# Patient Record
Sex: Male | Born: 1990 | Race: White | Hispanic: No | Marital: Single | Smoking: Current every day smoker
Health system: Southern US, Community
[De-identification: ages and names within clinical notes are randomized; demographics above are authoritative.]

---
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
## 2013-04-08 ENCOUNTER — Encounter: Payer: Self-pay | Admitting: Emergency Medicine

## 2013-04-08 ENCOUNTER — Emergency Department
Admission: EM | Admit: 2013-04-08 | Discharge: 2013-04-08 | Disposition: A | Discharge status: Home / Self Care | Payer: 59 | Source: Home / Self Care | Attending: Emergency Medicine | Admitting: Emergency Medicine

## 2013-04-08 DIAGNOSIS — R300 Dysuria: Secondary | ICD-10-CM

## 2013-04-08 LAB — POCT URINALYSIS DIP (MANUAL ENTRY)
GLUCOSE UA: NEGATIVE
LEUKOCYTES UA: NEGATIVE
Nitrite, UA: NEGATIVE
PROTEIN UA: NEGATIVE
RBC UA: NEGATIVE
SPEC GRAV UA: 1.025 (ref 1.005–1.03)
UROBILINOGEN UA: 1 (ref 0–1)
pH, UA: 6 (ref 5–8)

## 2013-04-08 MED ORDER — CEFTRIAXONE SODIUM 500 MG IJ SOLR
500.0000 mg | Freq: Once | INTRAMUSCULAR | Status: AC
  Administered 2013-04-08: 500 mg via INTRAMUSCULAR

## 2013-04-08 MED ORDER — AZITHROMYCIN 250 MG PO TABS: ORAL_TABLET | ORAL | Status: AC

## 2013-04-08 NOTE — ED Provider Notes (Signed)
CSN: [id number]     Arrival date & time 04/08/13  1253 History   First MD Initiated Contact with Patient 04/08/13 1304     Chief Complaint  Patient presents with  . Dysuria   (Consider location/radiation/quality/duration/timing/severity/associated sxs/prior Treatment) HPI Derrick Wells is a [age] y.o. male who presents today with dysuria for 2 days. But symptoms have been on & off for months.  Usually lasts a few days and then goes away. + dysuria +/- frequency No urgency No hematuria + white penile discharge No fever/chills +/- lower abdominal pain No back pain No fatigue   + possible history of kidney stone + currently monogamous with girlfriend and uses condoms. + possible history of STD but unsure  History reviewed. No pertinent past medical history. History reviewed. No pertinent past surgical history. History reviewed. No pertinent family history. History  Substance Use Topics  . Smoking status: Never Smoker   . Smokeless tobacco: Never Used  . Alcohol Use: No    Review of Systems  All other systems reviewed and are negative.    Allergies  Penicillins  Home Medications   Current Outpatient Rx  Name  Route  Sig  Dispense  Refill  . azithromycin (ZITHROMAX) 250 MG tablet      Take 1 gram PO x1 dose   4 each   0    BP 114/69  Pulse 72  Temp(Src) 98.4 F (36.9 C) (Oral)  Resp 16  Ht 5\' 11"  (1.803 m)  Wt 165 lb (74.844 kg)  BMI 23.02 kg/m2  SpO2 100% Physical Exam  Nursing note and vitals reviewed. Constitutional: He is oriented to person, place, and time. He appears well-developed and well-nourished.  Non-toxic appearance. He does not appear ill.  HENT:  Head: Normocephalic and atraumatic.  Eyes: No scleral icterus.  Neck: Neck supple.  Cardiovascular: Regular rhythm and normal heart sounds.   Pulmonary/Chest: Effort normal and breath sounds normal. No respiratory distress.  Abdominal: Soft. Normal appearance. There is no tenderness. There is no rebound,  no CVA tenderness, no tenderness at McBurney's point and negative Murphy's sign.  Neurological: He is alert and oriented to person, place, and time.  Skin: Skin is warm and dry.  Psychiatric: He has a normal mood and affect. His speech is normal.    ED Course  Procedures (including critical care time) Labs Review Labs Reviewed  URINE CULTURE  GC/CHLAMYDIA PROBE AMP, URINE  POCT URINALYSIS DIP (MANUAL ENTRY)   Imaging Review No results found.   MDM   1. Dysuria    23 year old male with dysuria.  Most likely STD.  UA normal.  Culture pending.  Urine GC & Chl pending.  IM rocephin given in clinic.  Pt with PCN allergy (rash) but not anaphylaxis. Pt monitored for 20 mins afterwards with no side effect.  1 gram of Zithromax called in to his pharmacy.  Safe sex practices discussed.  If results come back positive, his partner will also need to be treated.  Also gave him a strained to strain urine for the next few days in case is kidney stone related.  Follow up with PCP or urology if not improving.  Marlaine HindJeffrey H Henderson, MD 04/08/13 [phone]

## 2013-04-08 NOTE — ED Notes (Signed)
Patient c/o abdominal pain, dysuria and not completely voiding and white discharge after voiding for several months comes and goes. Has not tried anything otc.

## 2013-04-09 LAB — URINE CULTURE
Colony Count: NO GROWTH
Organism ID, Bacteria: NO GROWTH

## 2013-04-10 LAB — GC/CHLAMYDIA PROBE AMP, URINE
Chlamydia, Swab/Urine, PCR: NEGATIVE
GC Probe Amp, Urine: NEGATIVE

## 2021-11-17 ENCOUNTER — Emergency Department (HOSPITAL_BASED_OUTPATIENT_CLINIC_OR_DEPARTMENT_OTHER)
Admission: EM | Admit: 2021-11-17 | Discharge: 2021-11-17 | Disposition: A | Discharge status: Home / Self Care | Payer: Self-pay | Attending: Emergency Medicine | Admitting: Emergency Medicine

## 2021-11-17 ENCOUNTER — Other Ambulatory Visit: Payer: Self-pay

## 2021-11-17 ENCOUNTER — Encounter (HOSPITAL_BASED_OUTPATIENT_CLINIC_OR_DEPARTMENT_OTHER): Payer: Self-pay

## 2021-11-17 ENCOUNTER — Emergency Department (HOSPITAL_BASED_OUTPATIENT_CLINIC_OR_DEPARTMENT_OTHER): Payer: Self-pay

## 2021-11-17 DIAGNOSIS — N503 Cyst of epididymis: Secondary | ICD-10-CM | POA: Insufficient documentation

## 2021-11-17 LAB — URINALYSIS, ROUTINE W REFLEX MICROSCOPIC
Bilirubin Urine: NEGATIVE
Glucose, UA: NEGATIVE mg/dL
Hgb urine dipstick: NEGATIVE
Ketones, ur: NEGATIVE mg/dL
Leukocytes,Ua: NEGATIVE
Nitrite: NEGATIVE
Protein, ur: NEGATIVE mg/dL
Specific Gravity, Urine: 1.025 (ref 1.005–1.030)
pH: 5.5 (ref 5.0–8.0)

## 2021-11-17 MED ORDER — OXYCODONE HCL 5 MG PO TABS: 5.0000 mg | ORAL_TABLET | ORAL | 0 refills | Status: AC | PRN

## 2021-11-17 MED ORDER — IBUPROFEN 600 MG PO TABS: 600.0000 mg | ORAL_TABLET | Freq: Four times a day (QID) | ORAL | 0 refills | Status: AC | PRN

## 2021-11-17 MED ORDER — IBUPROFEN 400 MG PO TABS
600.0000 mg | ORAL_TABLET | Freq: Once | ORAL | Status: AC
  Administered 2021-11-17: 600 mg via ORAL
  Filled 2021-11-17: qty 1

## 2021-11-17 MED ORDER — OXYCODONE-ACETAMINOPHEN 5-325 MG PO TABS: 1.0000 | ORAL_TABLET | Freq: Once | ORAL | Status: DC

## 2021-11-17 NOTE — ED Provider Notes (Signed)
[facility] EMERGENCY DEPT Provider Note   CSN: [id number] Arrival date & time: 11/17/21  1232     History  Chief Complaint  Patient presents with   Testicle Pain    Derrick Wells is a 31 y.o. male.   Testicle Pain   31 year old male presents emergency department with complaints of testicular pain.  Patient states that pain has been present for approximately the past week.  He has taken no medication for this.  States that pain has been constant since onset and is exacerbated with touch as well as during sexual intercourse.  He states he is not concerned for sexually transmitted disease given that he is monogamous with his partner who present in the room with him.  Reports activity and unprotected anal intercourse.  Denies fever, chills, night sweats, abdominal pain, nausea, vomiting, dysuria, hematuria, urethral discharge, change in bowel habits.  Patient states the pain has been partially relieved by baths as well as warm wet rags.  No significant past medical history.  Home Medications Prior to Admission medications   Medication Sig Start Date End Date Taking? Authorizing Provider  ibuprofen (ADVIL) 600 MG tablet Take 1 tablet (600 mg total) by mouth every 6 (six) hours as needed. 11/17/21  Yes Sherian Maroon A, PA  oxyCODONE (ROXICODONE) 5 MG immediate release tablet Take 1 tablet (5 mg total) by mouth every 4 (four) hours as needed for severe pain. 11/17/21  Yes Peter Garter, PA  azithromycin (ZITHROMAX) 250 MG tablet Take 1 gram PO x1 dose 04/08/13   Marlaine Hind, MD      Allergies    Penicillins    Review of Systems   Review of Systems  Genitourinary:  Positive for testicular pain.  All other systems reviewed and are negative.   Physical Exam Updated Vital Signs BP 113/76 (BP Location: Right Arm)   Pulse 64   Temp 97.8 F (36.6 C) (Oral)   Resp 18   Ht 5\' 11"  (1.803 m)   Wt 72.6 kg   SpO2 100%   BMI 22.32 kg/m  Physical  Exam Vitals and nursing note reviewed.  Constitutional:      General: He is not in acute distress.    Appearance: He is well-developed.  HENT:     Head: Normocephalic and atraumatic.  Eyes:     Conjunctiva/sclera: Conjunctivae normal.  Cardiovascular:     Rate and Rhythm: Normal rate and regular rhythm.     Heart sounds: No murmur heard. Pulmonary:     Effort: Pulmonary effort is normal. No respiratory distress.     Breath sounds: Normal breath sounds.  Abdominal:     Palpations: Abdomen is soft.     Tenderness: There is no abdominal tenderness.  Genitourinary:    Pubic Area: No rash or pubic lice.      Penis: Normal and circumcised.      Comments: Patient's partner in room during GU exam.  No obvious overlying skin abnormalities of scrotum.  No erythema or swelling noted.  Patient has PT/being like mass on head of right epididymis.  Area is tender to the touch.  No gross tenderness to palpation of bilateral testicle.  No obvious inguinal hernia.  No obvious varicocele or hydrocele. Musculoskeletal:        General: No swelling.     Cervical back: Neck supple.  Skin:    General: Skin is warm and dry.     Capillary Refill: Capillary refill takes less than 2 seconds.  Neurological:     Mental Status: He is alert.  Psychiatric:        Mood and Affect: Mood normal.     ED Results / Procedures / Treatments   Labs (all labs ordered are listed, but only abnormal results are displayed) Labs Reviewed  URINALYSIS, ROUTINE W REFLEX MICROSCOPIC  GC/CHLAMYDIA PROBE AMP ([facility]) NOT AT [facility]    EKG None  Radiology US SCROTUM W/DOPPLER  Result Date: 11/17/2021 CLINICAL DATA:  Testicular swelling EXAM: SCROTAL ULTRASOUND DOPPLER ULTRASOUND OF THE TESTICLES TECHNIQUE: Complete ultrasound examination of the testicles, epididymis, and other scrotal structures was performed. Color and spectral Doppler ultrasound were also utilized to evaluate blood flow to the testicles.  COMPARISON:  None Available. FINDINGS: Right testicle Measurements: 4.8 x 3.1 x 3.3 cm. No mass or microlithiasis visualized. Left testicle Measurements: 5.5 x 3.0 x 3.3 cm. No mass or microlithiasis visualized. Right epididymis:  1.8 cm cyst within the epididymal head. Left epididymis:  Normal in size and appearance. Hydrocele:  None visualized. Varicocele:  None visualized. Pulsed Doppler interrogation of both testes demonstrates normal low resistance arterial and venous waveforms bilaterally. IMPRESSION: No testicular abnormality. 1.8 cm right epididymal head cyst. Electronically Signed   By: Charlett Nose M.D.   On: 11/17/2021 14:41    Procedures Procedures    Medications Ordered in ED Medications  ibuprofen (ADVIL) tablet 600 mg (has no administration in time range)    ED Course/ Medical Decision Making/ A&P Clinical Course as of 11/17/21 1651  Mon Nov 17, 2021  1552 1 week Baean Pea size Worse with sex No discharge [CR]    Clinical Course User Index [CR] Peter Garter, PA                           Medical Decision Making Amount and/or Complexity of Data Reviewed Labs: ordered. Radiology: ordered.   This patient presents to the ED for concern of testicular pain, this involves an extensive number of treatment options, and is a complaint that carries with it a high risk of complications and morbidity.  The differential diagnosis includes testicular torsion, epididymitis, malignancy, hydrocele, varicocele, cyst, hernia,   Co morbidities that complicate the patient evaluation  See HPI   Additional history obtained:  Additional history obtained from EMR External records from outside source obtained and reviewed including hospital records   Lab Tests:  I Ordered, and personally interpreted labs.  The pertinent results include: UA without abnormalities.  GC chlamydia pending.   Imaging Studies ordered:  I ordered imaging studies including scrotal ultrasound I  independently visualized and interpreted imaging which showed no testicular abnormality.  1.8 cm right epididymal head cyst. I agree with the radiologist interpretation  Cardiac Monitoring: / EKG:  The patient was maintained on a cardiac monitor.  I personally viewed and interpreted the cardiac monitored which showed an underlying rhythm of: Sinus rhythm   Consultations Obtained:  I requested consultation with the attending physician Dr. Criss Alvine regarding the patient and he was in agreement with treatment plan going forward.   Problem List / ED Course / Critical interventions / Medication management  Epididymal cyst I ordered medication including Advil for pain   Reevaluation of the patient after these medicines showed that the patient improved I have reviewed the patients home medicines and have made adjustments as needed   Social Determinants of Health:  Denies tobacco, illicit drug use.   Test / Admission - Considered:  Epididymal cyst Vitals signs within normal range and stable throughout visit. Laboratory/imaging studies significant for: See above Patient's work-up today overall reassuring.  Symptoms secondary to epidermal cyst.  No surrounding surrounding concern for epididymitis.  UA without any acute abnormalities.  Doubt infectious process.  Discussed with patient regarding empiric antibiotics despite findings with HPI concerning for unprotected anal sexual intercourse.  He declined at this time due to expected management of symptoms with pain control.  Recommend follow-up with urology outpatient as soon as able to make an appointment.  Symptomatic therapy recommended with Tylenol/Motrin as needed for pain.  Treatment plan discussed at length with patient and he acknowledged understanding was agreeable to said plan. Worrisome signs and symptoms were discussed with the patient, and the patient acknowledged understanding to return to the ED if noticed. Patient was stable upon  discharge.          Final Clinical Impression(s) / ED Diagnoses Final diagnoses:  Epididymal cyst    Rx / DC Orders ED Discharge Orders          Ordered    ibuprofen (ADVIL) 600 MG tablet  Every 6 hours PRN        11/17/21 1649    oxyCODONE (ROXICODONE) 5 MG immediate release tablet  Every 4 hours PRN        11/17/21 1649              Peter Garter, [place] 11/17/21 1651    Pricilla Loveless, MD 11/18/21 1709

## 2021-11-17 NOTE — Discharge Instructions (Addendum)
Note the work-up today was overall reassuring.  As discussed, take Tylenol/Motrin as needed for baseline pain.  Take pain medicine for breakthrough pain.  Continue to take warm baths/warm wet rags/ice as needed for symptomatic relief.  Call the number attached your discharge papers does have appointment with urology..  Follow MyChart for the results of your GC/chlamydia test results.  Please not hesitate to return to the emergency department if the worrisome signs and symptoms we discussed become apparent.

## 2021-11-17 NOTE — ED Triage Notes (Signed)
Pt states he has noticed swelling between his testicles and his penis that started last week. States he has "kidney bean sized swelling that feels like it is about to bust". Pt endorses cloudy penile discharge.

## 2021-11-18 LAB — GC/CHLAMYDIA PROBE AMP (~~LOC~~) NOT AT ARMC
Chlamydia: NEGATIVE
Comment: NEGATIVE
Comment: NORMAL
Neisseria Gonorrhea: NEGATIVE

## 2022-03-23 ENCOUNTER — Other Ambulatory Visit: Payer: Self-pay

## 2022-03-23 ENCOUNTER — Encounter (HOSPITAL_BASED_OUTPATIENT_CLINIC_OR_DEPARTMENT_OTHER): Payer: Self-pay

## 2022-03-23 ENCOUNTER — Emergency Department (HOSPITAL_BASED_OUTPATIENT_CLINIC_OR_DEPARTMENT_OTHER)
Admission: EM | Admit: 2022-03-23 | Discharge: 2022-03-23 | Disposition: A | Discharge status: Home / Self Care | Payer: Medicaid Other | Attending: Emergency Medicine | Admitting: Emergency Medicine

## 2022-03-23 DIAGNOSIS — L729 Follicular cyst of the skin and subcutaneous tissue, unspecified: Secondary | ICD-10-CM | POA: Insufficient documentation

## 2022-03-23 DIAGNOSIS — L089 Local infection of the skin and subcutaneous tissue, unspecified: Secondary | ICD-10-CM | POA: Insufficient documentation

## 2022-03-23 MED ORDER — DOXYCYCLINE HYCLATE 100 MG PO CAPS: 100.0000 mg | ORAL_CAPSULE | Freq: Two times a day (BID) | ORAL | 0 refills | Status: AC

## 2022-03-23 MED ORDER — ONDANSETRON HCL 4 MG PO TABS: 4.0000 mg | ORAL_TABLET | Freq: Four times a day (QID) | ORAL | 0 refills | Status: AC

## 2022-03-23 NOTE — ED Provider Notes (Signed)
[facility] Provider Note   CSN: [id number] Arrival date & time: 03/23/22  1502     History  Chief Complaint  Patient presents with   Abscess    Derrick Wells is a 32 y.o. male with noncontributory past medical history presents with concern for area on the back which she is concerned is infected bite versus cyst versus abscess.  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  He reports that this could have happened a week ago and he did not have any significant pain until 2 nights ago.  He reports some drainage from the hole, he reports it felt similar to cystic acne but does feel little bit of dizziness, nausea, and wants to make sure that he does not have a severe infection.  Abscess      Home Medications Prior to Admission medications   Medication Sig Start Date End Date Taking? Authorizing Provider  doxycycline (VIBRAMYCIN) 100 MG capsule Take 1 capsule (100 mg total) by mouth 2 (two) times daily. 03/23/22  Yes Kailan Laws H, PA-C  ondansetron (ZOFRAN) 4 MG tablet Take 1 tablet (4 mg total) by mouth every 6 (six) hours. 03/23/22  Yes Lekia Nier H, PA-C  azithromycin (ZITHROMAX) 250 MG tablet Take 1 gram PO x1 dose 04/08/13   Janeann Forehand, MD  ibuprofen (ADVIL) 600 MG tablet Take 1 tablet (600 mg total) by mouth every 6 (six) hours as needed. 11/17/21   Wilnette Kales, PA  oxyCODONE (ROXICODONE) 5 MG immediate release tablet Take 1 tablet (5 mg total) by mouth every 4 (four) hours as needed for severe pain. 11/17/21   Wilnette Kales, PA      Allergies    Penicillins    Review of Systems   Review of Systems  All other systems reviewed and are negative.   Physical Exam Updated Vital Signs BP 137/89   Pulse 83   Temp 98.3 F (36.8 C) (Oral)   Resp 14   Ht 5\' 11"  (1.803 m)   Wt 68 kg   SpO2 100%   BMI 20.92 kg/m  Physical  Exam Vitals and nursing note reviewed.  Constitutional:      General: He is not in acute distress.    Appearance: Normal appearance.  HENT:     Head: Normocephalic and atraumatic.  Eyes:     General:        Right eye: No discharge.        Left eye: No discharge.  Cardiovascular:     Rate and Rhythm: Normal rate and regular rhythm.  Pulmonary:     Effort: Pulmonary effort is normal. No respiratory distress.  Musculoskeletal:        General: No deformity.  Skin:    General: Skin is warm and dry.     Comments: Patient with a wound to the back, approximately 0.5 to 2 cm mildly fluctuant but appropriately draining wound.  Seems consistent with acneiform lesion which is cystic in nature, infected cyst, low clinical suspicion for necrotic skin wound  Neurological:     Mental Status: He is alert and oriented to person, place, and time.  Psychiatric:        Mood and Affect: Mood normal.        Behavior: Behavior normal.     ED Results / Procedures / Treatments   Labs (all labs ordered are  listed, but only abnormal results are displayed) Labs Reviewed - No data to display  EKG None  Radiology No results found.  Procedures Procedures    Medications Ordered in ED Medications - No data to display  ED Course/ Medical Decision Making/ A&P                             Medical Decision Making Risk Prescription drug management.   This patient is a 32 y.o. male who presents to the ED for concern of Wound on back.   Differential diagnoses prior to evaluation: Cyst requiring drainage, versus cellulitis, versus other Systemic infection  Past Medical History / Social History / Additional history: Chart reviewed. Pertinent results include: non contributory  Physical Exam: Physical exam performed. The pertinent findings include: Patient with abscess versus cystic acne presentation, it is with focal nidus of drainage, there is some surrounding skin redness, induration, but no  fluctuant drainable fluid collection on my exam  Medications / Treatment: Will discharge on anitbiotics, prescribed some nausea medication   Disposition: After consideration of the diagnostic results and the patients response to treatment, I feel that patient is stable for discharge .   emergency department workup does not suggest an emergent condition requiring admission or immediate intervention beyond what has been performed at this time. The plan is: as above. The patient is safe for discharge and has been instructed to return immediately for worsening symptoms, change in symptoms or any other concerns.  Final Clinical Impression(s) / ED Diagnoses Final diagnoses:  Infected cyst of skin    Rx / DC Orders ED Discharge Orders          Ordered    doxycycline (VIBRAMYCIN) 100 MG capsule  2 times daily        03/23/22 1549    ondansetron (ZOFRAN) 4 MG tablet  Every 6 hours        03/23/22 1549              Raylee Strehl, [place] H, PA-C 03/23/22 1606    Hayden Rasmussen, MD 03/24/22 1004

## 2022-03-23 NOTE — ED Triage Notes (Signed)
Patient here POV from Home.  Endorses waking up Saturday when he felt an Itch to his Back. Then he noticed a Possible Bite to the Area that has since progressed into a Small hole 0.5-1 cm in Size. Located to Back. Some Yellow Drainage. No Fever.  NAD noted during Triage. A&Ox4. GCS 15. Ambulatory.

## 2022-03-23 NOTE — Discharge Instructions (Signed)
He can use a lactobacillus probiotic with the antibiotics that prescribed, you can use the nausea medication as needed, please take the entire course of antibiotics and follow-up with your primary care doctor if you are concerned about ongoing infection.  It will take probably several days to a week or 2 for the skin site to heal.

## 2022-03-23 NOTE — ED Notes (Signed)
Patient given discharge instructions. Questions were answered. Patient verbalized understanding of discharge instructions and care at home.
